# Patient Record
Sex: Male | Born: 1985 | Race: White | Hispanic: No | Marital: Married | Smoking: Former smoker
Health system: Southern US, Community
[De-identification: ages and names within clinical notes are randomized; demographics above are authoritative.]

## PROBLEM LIST (undated history)

## (undated) DIAGNOSIS — R002 Palpitations: Secondary | ICD-10-CM

## (undated) HISTORY — PX: HERNIA REPAIR: SHX51

## (undated) HISTORY — DX: Palpitations: R00.2

## (undated) HISTORY — PX: CARDIAC CATHETERIZATION: SHX172

---

## 2015-08-06 DIAGNOSIS — K591 Functional diarrhea: Secondary | ICD-10-CM | POA: Diagnosis not present

## 2015-10-30 DIAGNOSIS — R1031 Right lower quadrant pain: Secondary | ICD-10-CM | POA: Diagnosis not present

## 2015-11-07 DIAGNOSIS — N50811 Right testicular pain: Secondary | ICD-10-CM

## 2015-11-07 HISTORY — DX: Right testicular pain: N50.811

## 2015-11-15 DIAGNOSIS — K5730 Diverticulosis of large intestine without perforation or abscess without bleeding: Secondary | ICD-10-CM | POA: Diagnosis not present

## 2015-11-15 DIAGNOSIS — R1031 Right lower quadrant pain: Secondary | ICD-10-CM | POA: Diagnosis not present

## 2015-11-15 DIAGNOSIS — N5089 Other specified disorders of the male genital organs: Secondary | ICD-10-CM | POA: Diagnosis not present

## 2015-11-15 DIAGNOSIS — N50811 Right testicular pain: Secondary | ICD-10-CM | POA: Diagnosis not present

## 2016-02-22 DIAGNOSIS — R55 Syncope and collapse: Secondary | ICD-10-CM | POA: Diagnosis not present

## 2016-02-22 DIAGNOSIS — J101 Influenza due to other identified influenza virus with other respiratory manifestations: Secondary | ICD-10-CM | POA: Diagnosis not present

## 2017-06-17 DIAGNOSIS — Z6829 Body mass index (BMI) 29.0-29.9, adult: Secondary | ICD-10-CM | POA: Diagnosis not present

## 2017-06-17 DIAGNOSIS — F4323 Adjustment disorder with mixed anxiety and depressed mood: Secondary | ICD-10-CM | POA: Diagnosis not present

## 2017-06-17 DIAGNOSIS — R6889 Other general symptoms and signs: Secondary | ICD-10-CM | POA: Diagnosis not present

## 2017-08-24 DIAGNOSIS — L255 Unspecified contact dermatitis due to plants, except food: Secondary | ICD-10-CM | POA: Diagnosis not present

## 2018-02-05 DIAGNOSIS — B9789 Other viral agents as the cause of diseases classified elsewhere: Secondary | ICD-10-CM | POA: Diagnosis not present

## 2018-02-05 DIAGNOSIS — A78 Q fever: Secondary | ICD-10-CM | POA: Diagnosis not present

## 2018-02-05 DIAGNOSIS — R509 Fever, unspecified: Secondary | ICD-10-CM | POA: Diagnosis not present

## 2018-02-09 DIAGNOSIS — R748 Abnormal levels of other serum enzymes: Secondary | ICD-10-CM | POA: Diagnosis not present

## 2018-02-09 DIAGNOSIS — Z79899 Other long term (current) drug therapy: Secondary | ICD-10-CM | POA: Diagnosis not present

## 2018-02-09 DIAGNOSIS — Z6830 Body mass index (BMI) 30.0-30.9, adult: Secondary | ICD-10-CM | POA: Diagnosis not present

## 2018-02-09 DIAGNOSIS — E559 Vitamin D deficiency, unspecified: Secondary | ICD-10-CM | POA: Diagnosis not present

## 2018-02-09 DIAGNOSIS — F329 Major depressive disorder, single episode, unspecified: Secondary | ICD-10-CM | POA: Diagnosis not present

## 2018-04-13 DIAGNOSIS — J0100 Acute maxillary sinusitis, unspecified: Secondary | ICD-10-CM | POA: Diagnosis not present

## 2018-04-13 DIAGNOSIS — R509 Fever, unspecified: Secondary | ICD-10-CM | POA: Diagnosis not present

## 2018-08-09 DIAGNOSIS — R0602 Shortness of breath: Secondary | ICD-10-CM | POA: Diagnosis not present

## 2018-08-09 DIAGNOSIS — R002 Palpitations: Secondary | ICD-10-CM | POA: Diagnosis not present

## 2018-08-09 DIAGNOSIS — Z20828 Contact with and (suspected) exposure to other viral communicable diseases: Secondary | ICD-10-CM | POA: Diagnosis not present

## 2018-08-09 DIAGNOSIS — R0600 Dyspnea, unspecified: Secondary | ICD-10-CM | POA: Diagnosis not present

## 2018-08-09 DIAGNOSIS — I499 Cardiac arrhythmia, unspecified: Secondary | ICD-10-CM | POA: Diagnosis not present

## 2018-08-19 ENCOUNTER — Other Ambulatory Visit: Payer: Self-pay

## 2018-08-19 ENCOUNTER — Encounter: Payer: Self-pay | Admitting: Cardiology

## 2018-08-19 ENCOUNTER — Ambulatory Visit (INDEPENDENT_AMBULATORY_CARE_PROVIDER_SITE_OTHER): Payer: BC Managed Care – PPO | Admitting: Cardiology

## 2018-08-19 ENCOUNTER — Encounter: Payer: Self-pay | Admitting: *Deleted

## 2018-08-19 VITALS — BP 138/88 | HR 82 | Temp 97.9°F | Ht 74.0 in | Wt 239.8 lb

## 2018-08-19 DIAGNOSIS — R002 Palpitations: Secondary | ICD-10-CM

## 2018-08-19 HISTORY — DX: Palpitations: R00.2

## 2018-08-19 NOTE — Progress Notes (Signed)
Cardiology Office Note:    Date:  08/19/2018   ID:  Kevin Glenn, DOB 10/22/1985, MRN [id number]  PCP:  [facility]  Cardiologist:  Norman HerrlichBrian Melanye Hiraldo, MD   Referring MD: [facility]  ASSESSMENT:    1. Palpitations    PLAN:    In order of problems listed above:  1. The rhythm strip in the office has artifact but I am fairly sure he is in sinus rhythm at beginning sinus rhythm at the end and had an atypical atrial flutter with a mildly increased rate but less than 100 210 bpm associated with symptoms.  We discussed this and is likely the problem he had when he underwent EP [facility] I asked him to wear high fidelity ZIO monitor from 3 to 7 days sure we catch episodes often referral to EP he declines and at this time were not can put him on suppressive medications.  He has no risk factors for stroke I will not anticoagulate and he has to hold on additional testing like echocardiogram at this time.  If we can get a clear-cut documentation on a ZIO monitor I think you will accept EP referral and catheter ablation.  Next appointment 4   Medication Adjustments/Labs and Tests Ordered: Current medicines are reviewed at length with the patient today.  Concerns regarding medicines are outlined above.  Orders Placed This Encounter  Procedures   LONG TERM MONITOR (3-14 DAYS)   EKG 12-Lead   No orders of the defined types were placed in this encounter.    Chief Complaint  Patient presents with   Palpitations    History of Present Illness:    Kevin Glenn is a 33 y.o. male who is being seen today for the evaluation of SOB and arrhthymia after recent [facility] ED visit. He was seen at the ED 08/09/18 for complaints of palpitations. Notes he was previously on "metoprolol and cardizem, but has no longer needed the medication". He had cardiac evaluation in 2014 at [facility].  It also been seen on multiple occasions by EP [facility] but the records cannot be reviewed with in care everywhere.  In the ED he had two tropinin <0.01, negative d-dimer, K 4.0. CXR with no acute changes.   In 2005 he is seen by [facility] from his description he had a EP test they discussed ablation but decided to treat him with a calcium channel blocker did well for years but he stopped it about 5 years later and in particular he blamed it for weight gain.  In the interim he has had intermittent episodes never severe never sustained.  Recently has developed something different he also have a sensation of pausing irregularity he is breathless for moment and lightheaded but has not had syncope.  He is frustrated because he was seen in urgent care in the emergency room and the episodes were missed.  While he is in the office with me he started to have symptoms we hooked him up to the EKG machine I ran a rhythm strip and he has atrial flutter with a relatively slow ventricular rate.  Unfortunately there is artifact as he was sitting forward I am fairly certain that we captured a true arrhythmia I did ask him to wear a ZIO monitor for 3 to 7 days to be sure to capture his problem is happening frequently and he wants to talk to me  afterwards but is inclined to follow through with the EP and catheter ablation that was discussed in 2005.  Recent evaluation in the emergency room showed no evidence of thyroid disease he said he has had multiple echocardiograms the last few years ago and has no structural heart disease.  He takes no over-the-counter cold sinus flu allergy or decongestant medications but was put on Atarax in the emergency room that made him much worse and he discontinued.  He is a very physical athletic man and has had no symptoms with physical effort and the fact I have improved his symptoms.  He has no family history of sudden cardiac death or arrhythmia  Past Medical History:  Diagnosis Date   Palpitation     Past  Surgical History:  Procedure Laterality Date   CARDIAC CATHETERIZATION     HERNIA REPAIR      Current Medications: No outpatient medications have been marked as taking for the 08/19/18 encounter (Office Visit) with Baldo DaubMunley, Thursa Emme J, MD.     Allergies:   Pseudoephedrine   Social History   Socioeconomic History   Marital status: Married    Spouse name: Not on file   Number of children: Not on file   Years of education: Not on file   Highest education level: Not on file  Occupational History   Not on file  Social Needs   Financial resource strain: Not on file   Food insecurity    Worry: Not on file    Inability: Not on file   Transportation needs    Medical: Not on file    Non-medical: Not on file  Tobacco Use   Smoking status: Former Smoker    Packs/day: 1.00    Years: 10.00    Pack years: 10.00    Types: Cigarettes    Quit date: 2015    Years since quitting: 5.5   Smokeless tobacco: Never Used  Substance and Sexual Activity   Alcohol use: Not Currently   Drug use: Not Currently   Sexual activity: Not on file  Lifestyle   Physical activity    Days per week: Not on file    Minutes per session: Not on file   Stress: Not on file  Relationships   Social connections    Talks on phone: Not on file    Gets together: Not on file    Attends religious service: Not on file    Active member of club or organization: Not on file    Attends meetings of clubs or organizations: Not on file    Relationship status: Not on file  Other Topics Concern   Not on file  Social History Narrative   Not on file     Family History: The patient's family history includes Congestive Heart Failure in his paternal grandfather; Esophageal cancer in his maternal grandmother; Hyperlipidemia in his mother; Hypertension in his father; Lung cancer in his maternal grandmother.  ROS:   Review of Systems  Constitution: Negative.  HENT: Negative.   Eyes: Negative.     Cardiovascular: Positive for irregular heartbeat.  Respiratory: Negative.   Endocrine: Negative.   Hematologic/Lymphatic: Negative.   Skin: Negative.   Musculoskeletal: Negative.   Gastrointestinal: Negative.   Genitourinary: Negative.   Neurological: Negative.   Psychiatric/Behavioral: Positive for depression.  Allergic/Immunologic: Negative.    Please see the history of present illness.     All other systems reviewed and are negative.  EKGs/Labs/Other Studies Reviewed:    The following studies  were reviewed today:   EKG:  EKG is  ordered today.  The ekg ordered today is personally reviewed and demonstrates sinus rhythm and is normal.  Rhythm strip captures  flutter with relatively slow conduction and captures and converting to sinus rhythm.  I think this is an atypical flutter.   Physical Exam:    VS:  BP 138/88 (BP Location: Left Arm, Patient Position: Sitting, Cuff Size: Large)    Pulse 82    Temp 97.9 F (36.6 C)    Ht 6\' 2"  (1.88 m)    Wt 239 lb 12.8 oz (108.8 kg)    SpO2 98%    BMI 30.79 kg/m     Wt Readings from Last 3 Encounters:  08/19/18 239 lb 12.8 oz (108.8 kg)     GEN:  Well nourished, well developed in no acute distress HEENT: Normal NECK: No JVD; No carotid bruits LYMPHATICS: No lymphadenopathy CARDIAC: RRR, no murmurs, rubs, gallops RESPIRATORY:  Clear to auscultation without rales, wheezing or rhonchi  ABDOMEN: Soft, non-tender, non-distended MUSCULOSKELETAL:  No edema; No deformity  SKIN: Warm and dry NEUROLOGIC:  Alert and oriented x 3 PSYCHIATRIC:  Normal affect     Signed, Shirlee More, MD  08/19/2018 3:27 PM    [facility]

## 2018-08-19 NOTE — Patient Instructions (Signed)
Medication Instructions:  Your physician recommends that you continue on your current medications as directed. Please refer to the Current Medication list given to you today.  If you need a refill on your cardiac medications before your next appointment, please call your pharmacy.   Lab work: None  If you have labs (blood work) drawn today and your tests are completely normal, you will receive your results only by: Marland Kitchen MyChart Message (if you have MyChart) OR . A paper copy in the mail If you have any lab test that is abnormal or we need to change your treatment, we will call you to review the results.  Testing/Procedures: You had an EKG today.   Your physician has recommended that you wear a ZIO monitor. ZIO monitors are medical devices that record the heart's electrical activity. Doctors most often use these monitors to diagnose arrhythmias. Arrhythmias are problems with the speed or rhythm of the heartbeat. The monitor is a small, portable device. You can wear one while you do your normal daily activities. This is usually used to diagnose what is causing palpitations/syncope (passing out). Wear for 7 days.   Follow-Up: At [facility], you and your health needs are our priority.  As part of our continuing mission to provide you with exceptional heart care, we have created designated Provider Care Teams.  These Care Teams include your primary Cardiologist (physician) and Advanced Practice Providers (APPs -  Physician Assistants and Nurse Practitioners) who all work together to provide you with the care you need, when you need it. You will need a follow up appointment in 4 weeks.

## 2018-09-16 ENCOUNTER — Ambulatory Visit: Payer: BC Managed Care – PPO | Admitting: Cardiology

## 2018-11-06 NOTE — Progress Notes (Deleted)
Cardiology Office Note:    Date:  11/06/2018   ID:  Kevin Glenn, DOB [birth year], MRN [id number]  PCP:  [facility]  Cardiologist:  Norman Herrlich, MD    Referring MD: [facility], [facility]    ASSESSMENT:    No diagnosis found. PLAN:    In order of problems listed above:  1. ***   Next appointment: ***   Medication Adjustments/Labs and Tests Ordered: Current medicines are reviewed at length with the patient today.  Concerns regarding medicines are outlined above.  No orders of the defined types were placed in this encounter.  No orders of the defined types were placed in this encounter.   No chief complaint on file.   History of Present Illness:    Kevin Glenn is a 33 y.o. male with a hx of atypical atrial flutter or atrial tachycardia captured on a rhythm strip in my office last seen 08/19/2018.Marland Kitchen Compliance with diet, lifestyle and medications: ***  He was seen at the ED 08/09/18 for complaints of palpitations. Notes he was previously on "metoprolol and cardizem, but has no longer needed the medication". He had cardiac evaluation in 2014 at [facility].  It also been seen on multiple occasions by EP 2005 [facility] but the records cannot be reviewed with in care everywhere.  In the ED he had two tropinin <0.01, negative d-dimer, K 4.0. CXR with no acute changes.    In 2005 he is seen by EP [facility] from his description he had a EP test they discussed ablation but decided to treat him with a calcium channel blocker did well for years but he stopped it about 5 years later and in particular he blamed it for weight gain.  In the interim he has had intermittent episodes never severe never sustained.  Recently has developed something different he also have a sensation of pausing irregularity he is breathless for moment and lightheaded but has not had syncope.  He is frustrated because he was seen in urgent care in the emergency room  and the episodes were missed.  While he is in the office with me 08/19/2010 he started to have symptoms we hooked him up to the EKG machine I ran a rhythm strip and he has atrial flutter with a relatively slow ventricular rate.  Past Medical History:  Diagnosis Date  . Palpitation     Past Surgical History:  Procedure Laterality Date  . CARDIAC CATHETERIZATION    . HERNIA REPAIR      Current Medications: No outpatient medications have been marked as taking for the 11/10/18 encounter (Appointment) with Baldo Daub, MD.     Allergies:   Pseudoephedrine   Social History   Socioeconomic History  . Marital status: Married    Spouse name: Not on file  . Number of children: Not on file  . Years of education: Not on file  . Highest education level: Not on file  Occupational History  . Not on file  Social Needs  . Financial resource strain: Not on file  . Food insecurity    Worry: Not on file    Inability: Not on file  . Transportation needs    Medical: Not on file    Non-medical: Not on file  Tobacco Use  . Smoking status: Former Smoker    Packs/day: 1.00    Years: 10.00    Pack years: 10.00    Types: Cigarettes  Quit date: 2015    Years since quitting: 5.7  . Smokeless tobacco: Never Used  Substance and Sexual Activity  . Alcohol use: Not Currently  . Drug use: Not Currently  . Sexual activity: Not on file  Lifestyle  . Physical activity    Days per week: Not on file    Minutes per session: Not on file  . Stress: Not on file  Relationships  . Social Herbalist on phone: Not on file    Gets together: Not on file    Attends religious service: Not on file    Active member of club or organization: Not on file    Attends meetings of clubs or organizations: Not on file    Relationship status: Not on file  Other Topics Concern  . Not on file  Social History Narrative  . Not on file     Family History: The patient's ***family history includes  Congestive Heart Failure in his paternal grandfather; Esophageal cancer in his maternal grandmother; Hyperlipidemia in his mother; Hypertension in his father; Lung cancer in his maternal grandmother. ROS:   Please see the history of present illness.    All other systems reviewed and are negative.  EKGs/Labs/Other Studies Reviewed:    The following studies were reviewed today:  EKG:  EKG ordered today and personally reviewed.  The ekg ordered today demonstrates ***  Recent Labs: No results found for requested labs within last 8760 hours.  Recent Lipid Panel No results found for: CHOL, TRIG, HDL, CHOLHDL, VLDL, LDLCALC, LDLDIRECT  Physical Exam:    VS:  There were no vitals taken for this visit.    Wt Readings from Last 3 Encounters:  08/19/18 239 lb 12.8 oz (108.8 kg)     GEN: *** Well nourished, well developed in no acute distress HEENT: Normal NECK: No JVD; No carotid bruits LYMPHATICS: No lymphadenopathy CARDIAC: ***RRR, no murmurs, rubs, gallops RESPIRATORY:  Clear to auscultation without rales, wheezing or rhonchi  ABDOMEN: Soft, non-tender, non-distended MUSCULOSKELETAL:  No edema; No deformity  SKIN: Warm and dry NEUROLOGIC:  Alert and oriented x 3 PSYCHIATRIC:  Normal affect    Signed, Shirlee More, MD  11/06/2018 2:26 PM    [place]

## 2018-11-10 ENCOUNTER — Ambulatory Visit: Payer: BC Managed Care – PPO | Admitting: Cardiology

## 2018-11-24 ENCOUNTER — Telehealth: Payer: Self-pay | Admitting: *Deleted

## 2018-11-24 NOTE — Telephone Encounter (Signed)
Called patient to check and see if he has mailed his ZIO monitor back to the company for processing. No answer, unable to leave message. Will continue efforts.

## 2018-11-24 NOTE — Telephone Encounter (Signed)
-----   Message from Richardo Priest, MD sent at 11/22/2018  8:34 AM EDT ----- Regarding: FW: Lost Monitor Barnetta Chapel will you please call him and inquire if he still has the monitor there he wants to send it in for evaluation. ----- Message ----- From: Austin Miles, RN Sent: 11/22/2018   8:18 AM EDT To: Richardo Priest, MD Subject: Melton Alar: Lost Monitor                               FYI  ----- Message ----- From: Anselm Pancoast, CMA Sent: 11/19/2018   3:44 PM EDT To: Austin Miles, RN Subject: Lost Monitor                                   Dr. Bettina Gavia asked about this Pt's monitor. I have looked in iRhthm and it states that this monitor is "Lost".  ----- Message ----- From: Richardo Priest, MD Sent: 11/06/2018   2:27 PM EDT To: Anselm Pancoast, CMA  Has his ZIO monitor been processed?

## 2018-11-26 NOTE — Telephone Encounter (Signed)
Called and left a message for our [employer] representative, Ailene Ravel, to return call to discuss.

## 2018-11-26 NOTE — Telephone Encounter (Signed)
Spoke with patient who confirms that he wore the 7 day ZIO monitor and mailed it back to the company sometime in September 2020.

## 2018-11-26 NOTE — Telephone Encounter (Signed)
Kevin Glenn returned my call and verified that the monitor was never received by the company for processing. Kevin Glenn explained that this does happen on occasion but only approximately 2-3% of the time when returning the monitors via mail. Please advise if you would like the patient to wear another monitor at no cost. Thanks!

## 2018-11-26 NOTE — Telephone Encounter (Signed)
Can you call the Zio rep to investigate. If truly lost they should repeat at no cost

## 2018-11-27 NOTE — Telephone Encounter (Signed)
Yes repeat Zio

## 2018-11-29 NOTE — Telephone Encounter (Signed)
Attempted to contact patient with no answer, unable to leave message. Will continue efforts.  

## 2018-12-07 NOTE — Telephone Encounter (Signed)
Attempted to contact patient with no answer, unable to leave message. Will continue efforts.  

## 2018-12-21 NOTE — Telephone Encounter (Signed)
Spoke with patient and advised that Dr. Bettina Gavia recommends repeating the 7 day ZIO monitor since the previous monitor was lost in the mail. Patient reports no symptoms in quite some time and does not feel the need to repeat the monitor now. Patient will contact our office if symptoms worsen or resurface at any time as he will be willing to repeat the monitor then. No further questions.

## 2019-01-26 DIAGNOSIS — J069 Acute upper respiratory infection, unspecified: Secondary | ICD-10-CM | POA: Diagnosis not present

## 2019-01-26 DIAGNOSIS — Z20828 Contact with and (suspected) exposure to other viral communicable diseases: Secondary | ICD-10-CM | POA: Diagnosis not present

## 2019-08-19 DIAGNOSIS — A6920 Lyme disease, unspecified: Secondary | ICD-10-CM | POA: Diagnosis not present

## 2019-08-19 DIAGNOSIS — D649 Anemia, unspecified: Secondary | ICD-10-CM | POA: Diagnosis not present

## 2019-08-19 DIAGNOSIS — M25511 Pain in right shoulder: Secondary | ICD-10-CM | POA: Diagnosis not present

## 2019-08-19 DIAGNOSIS — R748 Abnormal levels of other serum enzymes: Secondary | ICD-10-CM | POA: Diagnosis not present

## 2019-08-19 DIAGNOSIS — E559 Vitamin D deficiency, unspecified: Secondary | ICD-10-CM | POA: Diagnosis not present

## 2019-08-19 DIAGNOSIS — R768 Other specified abnormal immunological findings in serum: Secondary | ICD-10-CM | POA: Diagnosis not present

## 2019-08-19 DIAGNOSIS — Z8679 Personal history of other diseases of the circulatory system: Secondary | ICD-10-CM | POA: Diagnosis not present

## 2019-08-19 DIAGNOSIS — G8929 Other chronic pain: Secondary | ICD-10-CM | POA: Diagnosis not present

## 2019-08-23 DIAGNOSIS — D649 Anemia, unspecified: Secondary | ICD-10-CM | POA: Diagnosis not present

## 2019-09-14 DIAGNOSIS — Z20828 Contact with and (suspected) exposure to other viral communicable diseases: Secondary | ICD-10-CM | POA: Diagnosis not present

## 2019-09-14 DIAGNOSIS — R509 Fever, unspecified: Secondary | ICD-10-CM | POA: Diagnosis not present

## 2019-12-22 DIAGNOSIS — R1011 Right upper quadrant pain: Secondary | ICD-10-CM | POA: Diagnosis not present

## 2019-12-22 DIAGNOSIS — Z1331 Encounter for screening for depression: Secondary | ICD-10-CM | POA: Diagnosis not present

## 2019-12-22 DIAGNOSIS — Z6831 Body mass index (BMI) 31.0-31.9, adult: Secondary | ICD-10-CM | POA: Diagnosis not present

## 2019-12-27 DIAGNOSIS — K760 Fatty (change of) liver, not elsewhere classified: Secondary | ICD-10-CM | POA: Diagnosis not present

## 2019-12-27 DIAGNOSIS — R1011 Right upper quadrant pain: Secondary | ICD-10-CM | POA: Diagnosis not present

## 2020-03-23 DIAGNOSIS — R1011 Right upper quadrant pain: Secondary | ICD-10-CM | POA: Diagnosis not present

## 2020-03-23 DIAGNOSIS — R110 Nausea: Secondary | ICD-10-CM | POA: Diagnosis not present

## 2020-03-23 DIAGNOSIS — Z6831 Body mass index (BMI) 31.0-31.9, adult: Secondary | ICD-10-CM | POA: Diagnosis not present

## 2020-03-27 DIAGNOSIS — R1011 Right upper quadrant pain: Secondary | ICD-10-CM | POA: Diagnosis not present

## 2020-03-27 DIAGNOSIS — R197 Diarrhea, unspecified: Secondary | ICD-10-CM | POA: Diagnosis not present

## 2020-03-29 ENCOUNTER — Telehealth: Payer: Self-pay | Admitting: Cardiology

## 2020-03-29 DIAGNOSIS — R1011 Right upper quadrant pain: Secondary | ICD-10-CM

## 2020-03-29 HISTORY — DX: Right upper quadrant pain: R10.11

## 2020-03-29 NOTE — Telephone Encounter (Signed)
Pt has appt 04/02/20 with Dr. Dulce Sellar. Will send notes to MD for upcoming appt.

## 2020-03-29 NOTE — Telephone Encounter (Signed)
   Primary Cardiologist: Norman Herrlich, MD  Chart reviewed as part of pre-operative protocol coverage. Because of Trevious Rampey Wachter's past medical history and time since last visit, he will require a follow-up visit in order to better assess preoperative cardiovascular risk.  Pre-op covering staff: - Please schedule appointment and call patient to inform them. If patient already had an upcoming appointment within acceptable timeframe, please add "pre-op clearance" to the appointment notes so provider is aware. - Please contact requesting surgeon's office via preferred method (i.e, phone, fax) to inform them of need for appointment prior to surgery.  If applicable, this message will also be routed to pharmacy pool and/or primary cardiologist for input on holding anticoagulant/antiplatelet agent as requested below so that this information is available to the clearing provider at time of patient's appointment.   [place], [place]  03/29/2020, 3:54 PM

## 2020-03-29 NOTE — Telephone Encounter (Signed)
° °  [facility] Pre-operative Risk Assessment    Request for surgical clearance:  1. What type of surgery is being performed? Galbladder Removal   2. When is this surgery scheduled? TBD   3. What type of clearance is required (medical clearance vs. Pharmacy clearance to hold med vs. Both)? Medical Clearance  4. Are there any medications that need to be held prior to surgery and how long? No   5. Practice name and name of physician performing surgery? [facility]   Dr. Corena Pilgrim   6. What is your office phone number? [phone]   7.   What is your office fax number? [phone]  8.   Anesthesia type (None, local, MAC, general) ?  Tonya with Dr. Damita Dunnings office states she is unsure, but their office plans to fax a cardiac optimization which should include the type of anesthesia   Zara Council 03/29/2020, 2:39 PM  _________________________________________________________________

## 2020-03-30 ENCOUNTER — Telehealth: Payer: Self-pay

## 2020-03-30 DIAGNOSIS — R55 Syncope and collapse: Secondary | ICD-10-CM

## 2020-03-30 HISTORY — DX: Syncope and collapse: R55

## 2020-03-30 NOTE — Telephone Encounter (Signed)
This is a duplicate request for the same procedure. Please see preop note from yesterday. Patient is scheduled to see Dr. Dulce Sellar on 04/02/2020.

## 2020-03-30 NOTE — Telephone Encounter (Signed)
   [facility] Pre-operative Risk Assessment    HEARTCARE STAFF: - Please ensure there is not already an duplicate clearance open for this procedure. - Under Visit Info/Reason for Call, type in Other and utilize the format Clearance MM/DD/YY or Clearance TBD. Do not use dashes or single digits. - If request is for dental extraction, please clarify the # of teeth to be extracted.  Request for surgical clearance:  1. What type of surgery is being performed? Laparoscopic Cholecystectomy   2. When is this surgery scheduled? 04/13/2020   3. What type of clearance is required (medical clearance vs. Pharmacy clearance to hold med vs. Both)? Both  4. Are there any medications that need to be held prior to surgery and how long? None noted   5. Practice name and name of physician performing surgery? [facility]. Dr. Coralie Keens   6. What is the office phone number? [phone]   7.   What is the office fax number? [phone]  8.   Anesthesia type (None, local, MAC, general) ? General   Kevin Glenn 03/30/2020, 9:46 AM  _________________________________________________________________   (provider comments below)

## 2020-03-31 NOTE — Progress Notes (Signed)
Cardiology Office Note:    Date:  04/02/2020   ID:  Kevin Glenn, DOB [birth year], MRN [id number]  PCP:  [facility]  Cardiologist:  Norman Herrlich, MD   Referring MD: [facility], [facility]*  ASSESSMENT:    1. Preoperative cardiovascular examination   2. Typical atrial flutter (HCC)    PLAN:    In order of problems listed above:  1. From a cardiology perspective he is at very low risk for his planned surgery he has had no recurrence of arrhythmia or palpitation normal EKG he is optimized and requires no further cardiology evaluation for planned cholecystectomy liver biopsy outpatient. 2. Stable his episode was brief self-limited and no recurrence is stroke risk is very low and does not require suppressant therapy or anticoagulation unless he has clinical recurrence  Next appointment as needed   Medication Adjustments/Labs and Tests Ordered: Current medicines are reviewed at length with the patient today.  Concerns regarding medicines are outlined above.  No orders of the defined types were placed in this encounter.  No orders of the defined types were placed in this encounter.    Chief Complaint  Patient presents with  . Pre-op Exam  . Atrial Flutter    History of Present Illness:    Kevin Glenn is a 35 y.o. male who is being seen today for preoperative cardiology evaluation prior to laparoscopic cholecystectomy scheduled 04/15/2019 Dr. Maryruth Bun at the request of [facility]*.  I had seen him previously 08/19/2018 with palpitation with  transient atrial flutter on a previous EKG.  Since then he has had no palpitation and from a cardiac perspective is done well He has had no chest pain shortness of palpitation or edema. He is having right upper quadrant pain and tells me he is going to have cholecystectomy and liver biopsy. He asked me to order liver tests recommended by his surgeon if we can get an answer from the office what they want I  will put an order in otherwise he will have a performed in [place]. His revised surgical risk index is 0 low risk His EKG today sinus rhythm normal Past Medical History:  Diagnosis Date  . Pain in right testicle 11/07/2015  . Palpitation   . Palpitations 08/19/2018  . Right upper quadrant pain 03/29/2020  . Syncope 03/30/2020    Past Surgical History:  Procedure Laterality Date  . CARDIAC CATHETERIZATION    . HERNIA REPAIR      Current Medications: No outpatient medications have been marked as taking for the 04/02/20 encounter (Office Visit) with Baldo Daub, MD.     Allergies:   Pseudoephedrine   Social History   Socioeconomic History  . Marital status: Married    Spouse name: Not on file  . Number of children: Not on file  . Years of education: Not on file  . Highest education level: Not on file  Occupational History  . Not on file  Tobacco Use  . Smoking status: Former Smoker    Packs/day: 1.00    Years: 10.00    Pack years: 10.00    Types: Cigarettes    Quit date: 2015    Years since quitting: 7.1  . Smokeless tobacco: Never Used  Vaping Use  . Vaping Use: Never used  Substance and Sexual Activity  . Alcohol use: Not Currently  . Drug use: Not Currently  . Sexual activity: Not on file  Other Topics Concern  . Not on  file  Social History Narrative  . Not on file   Social Determinants of Health   Financial Resource Strain: Not on file  Food Insecurity: Not on file  Transportation Needs: Not on file  Physical Activity: Not on file  Stress: Not on file  Social Connections: Not on file     Family History: The patient's family history includes Congestive Heart Failure in his paternal grandfather; Esophageal cancer in his maternal grandmother; Hyperlipidemia in his mother; Hypertension in his father; Lung cancer in his maternal grandmother.  ROS:   ROS Please see the history of present illness.     All other systems reviewed and are  negative.  EKGs/Labs/Other Studies Reviewed:    The following studies were reviewed today:   EKG:  EKG is  ordered today.  The ekg ordered today is personally reviewed and demonstrates sinus rhythm normal  Recent Labs: Most recent 12/11/2018 cholesterol 131 triglycerides 161 HDL 24 03/23/2020 creatinine 1.10  Physical Exam:    VS:  BP 132/80   Pulse 81   Ht 6\' 2"  (1.88 m)   Wt 254 lb (115.2 kg)   SpO2 97%   BMI 32.61 kg/m     Wt Readings from Last 3 Encounters:  04/02/20 254 lb (115.2 kg)  08/19/18 239 lb 12.8 oz (108.8 kg)     GEN: Healthy appearing well nourished, well developed in no acute distress HEENT: Normal NECK: No JVD; No carotid bruits LYMPHATICS: No lymphadenopathy CARDIAC: RRR, no murmurs, rubs, gallops RESPIRATORY:  Clear to auscultation without rales, wheezing or rhonchi  ABDOMEN: Soft, non-tender, non-distended MUSCULOSKELETAL:  No edema; No deformity  SKIN: Warm and dry NEUROLOGIC:  Alert and oriented x 3 PSYCHIATRIC:  Normal affect     Signed, 08/21/18, MD  04/02/2020 3:55 PM    [facility]

## 2020-04-02 ENCOUNTER — Ambulatory Visit: Payer: BC Managed Care – PPO | Admitting: Cardiology

## 2020-04-02 ENCOUNTER — Encounter: Payer: Self-pay | Admitting: Cardiology

## 2020-04-02 ENCOUNTER — Other Ambulatory Visit: Payer: Self-pay

## 2020-04-02 VITALS — BP 132/80 | HR 81 | Ht 74.0 in | Wt 254.0 lb

## 2020-04-02 DIAGNOSIS — R1011 Right upper quadrant pain: Secondary | ICD-10-CM | POA: Diagnosis not present

## 2020-04-02 DIAGNOSIS — Z01810 Encounter for preprocedural cardiovascular examination: Secondary | ICD-10-CM | POA: Diagnosis not present

## 2020-04-02 DIAGNOSIS — I483 Typical atrial flutter: Secondary | ICD-10-CM

## 2020-04-02 NOTE — Patient Instructions (Signed)
Medication Instructions:  No medication changes. *If you need a refill on your cardiac medications before your next appointment, please call your pharmacy*   Lab Work: None ordered If you have labs (blood work) drawn today and your tests are completely normal, you will receive your results only by: Marland Kitchen MyChart Message (if you have MyChart) OR . A paper copy in the mail If you have any lab test that is abnormal or we need to change your treatment, we will call you to review the results.   Testing/Procedures: None ordered   Follow-Up: At [facility], you and your health needs are our priority.  As part of our continuing mission to provide you with exceptional heart care, we have created designated Provider Care Teams.  These Care Teams include your primary Cardiologist (physician) and Advanced Practice Providers (APPs -  Physician Assistants and Nurse Practitioners) who all work together to provide you with the care you need, when you need it.  We recommend signing up for the patient portal called "MyChart".  Sign up information is provided on this After Visit Summary.  MyChart is used to connect with patients for Virtual Visits (Telemedicine).  Patients are able to view lab/test results, encounter notes, upcoming appointments, etc.  Non-urgent messages can be sent to your provider as well.   To learn more about what you can do with MyChart, go to [email or web address].    Your next appointment:   As needed   The format for your next appointment:   In Person  Provider:   Norman Herrlich, MD   Other Instructions  Echocardiogram An echocardiogram is a test that uses sound waves (ultrasound) to produce images of the heart. Images from an echocardiogram can provide important information about:  Heart size and shape.  The size and thickness and movement of your heart's walls.  Heart muscle function and strength.  Heart valve function or if you have stenosis. Stenosis is when the  heart valves are too narrow.  If blood is flowing backward through the heart valves (regurgitation).  A tumor or infectious growth around the heart valves.  Areas of heart muscle that are not working well because of poor blood flow or injury from a heart attack.  Aneurysm detection. An aneurysm is a weak or damaged part of an artery wall. The wall bulges out from the normal force of blood pumping through the body. Tell a health care provider about:  Any allergies you have.  All medicines you are taking, including vitamins, herbs, eye drops, creams, and over-the-counter medicines.  Any blood disorders you have.  Any surgeries you have had.  Any medical conditions you have.  Whether you are pregnant or may be pregnant. What are the risks? Generally, this is a safe test. However, problems may occur, including an allergic reaction to dye (contrast) that may be used during the test. What happens before the test? No specific preparation is needed. You may eat and drink normally. What happens during the test?  You will take off your clothes from the waist up and put on a hospital gown.  Electrodes or electrocardiogram (ECG)patches may be placed on your chest. The electrodes or patches are then connected to a device that monitors your heart rate and rhythm.  You will lie down on a table for an ultrasound exam. A gel will be applied to your chest to help sound waves pass through your skin.  A handheld device, called a transducer, will be pressed against your chest and  moved over your heart. The transducer produces sound waves that travel to your heart and bounce back (or "echo" back) to the transducer. These sound waves will be captured in real-time and changed into images of your heart that can be viewed on a video monitor. The images will be recorded on a computer and reviewed by your health care provider.  You may be asked to change positions or hold your breath for a short time. This  makes it easier to get different views or better views of your heart.  In some cases, you may receive contrast through an IV in one of your veins. This can improve the quality of the pictures from your heart. The procedure may vary among health care providers and hospitals.   What can I expect after the test? You may return to your normal, everyday life, including diet, activities, and medicines, unless your health care provider tells you not to do that. Follow these instructions at home:  It is up to you to get the results of your test. Ask your health care provider, or the department that is doing the test, when your results will be ready.  Keep all follow-up visits. This is important. Summary  An echocardiogram is a test that uses sound waves (ultrasound) to produce images of the heart.  Images from an echocardiogram can provide important information about the size and shape of your heart, heart muscle function, heart valve function, and other possible heart problems.  You do not need to do anything to prepare before this test. You may eat and drink normally.  After the echocardiogram is completed, you may return to your normal, everyday life, unless your health care provider tells you not to do that. This information is not intended to replace advice given to you by your health care provider. Make sure you discuss any questions you have with your health care provider. Document Revised: 09/13/2019 Document Reviewed: 09/13/2019 Elsevier Patient Education  2021 [employer].

## 2020-04-03 LAB — HEPATITIS PANEL, ACUTE
Hep A IgM: NEGATIVE
Hep B C IgM: NEGATIVE
Hep C Virus Ab: <0.1 s/co ratio (ref 0.0–0.9)
Hepatitis B Surface Ag: NEGATIVE

## 2020-04-10 DIAGNOSIS — Z1152 Encounter for screening for COVID-19: Secondary | ICD-10-CM | POA: Diagnosis not present

## 2020-04-13 DIAGNOSIS — Z9049 Acquired absence of other specified parts of digestive tract: Secondary | ICD-10-CM | POA: Diagnosis not present

## 2020-04-13 DIAGNOSIS — R1011 Right upper quadrant pain: Secondary | ICD-10-CM | POA: Diagnosis not present

## 2020-04-13 DIAGNOSIS — R0683 Snoring: Secondary | ICD-10-CM | POA: Diagnosis not present

## 2020-04-13 DIAGNOSIS — K811 Chronic cholecystitis: Secondary | ICD-10-CM | POA: Diagnosis not present

## 2020-04-13 DIAGNOSIS — K660 Peritoneal adhesions (postprocedural) (postinfection): Secondary | ICD-10-CM | POA: Diagnosis not present

## 2020-04-13 DIAGNOSIS — F418 Other specified anxiety disorders: Secondary | ICD-10-CM | POA: Diagnosis not present

## 2020-04-13 DIAGNOSIS — E669 Obesity, unspecified: Secondary | ICD-10-CM | POA: Diagnosis not present

## 2020-04-13 DIAGNOSIS — K760 Fatty (change of) liver, not elsewhere classified: Secondary | ICD-10-CM | POA: Diagnosis not present

## 2020-04-13 DIAGNOSIS — Z6833 Body mass index (BMI) 33.0-33.9, adult: Secondary | ICD-10-CM | POA: Diagnosis not present

## 2020-04-13 DIAGNOSIS — K8010 Calculus of gallbladder with chronic cholecystitis without obstruction: Secondary | ICD-10-CM | POA: Diagnosis not present

## 2020-11-12 DIAGNOSIS — Z6832 Body mass index (BMI) 32.0-32.9, adult: Secondary | ICD-10-CM | POA: Diagnosis not present

## 2020-11-12 DIAGNOSIS — R109 Unspecified abdominal pain: Secondary | ICD-10-CM | POA: Diagnosis not present

## 2020-11-15 ENCOUNTER — Other Ambulatory Visit (HOSPITAL_BASED_OUTPATIENT_CLINIC_OR_DEPARTMENT_OTHER): Payer: Self-pay | Admitting: Registered Nurse

## 2020-11-15 ENCOUNTER — Other Ambulatory Visit: Payer: Self-pay

## 2020-11-15 ENCOUNTER — Ambulatory Visit (HOSPITAL_BASED_OUTPATIENT_CLINIC_OR_DEPARTMENT_OTHER)
Admission: RE | Admit: 2020-11-15 | Discharge: 2020-11-15 | Disposition: A | Discharge status: Home / Self Care | Payer: BC Managed Care – PPO | Source: Ambulatory Visit | Attending: Registered Nurse | Admitting: Registered Nurse

## 2020-11-15 DIAGNOSIS — R109 Unspecified abdominal pain: Secondary | ICD-10-CM

## 2020-11-15 MED ORDER — IOHEXOL 300 MG/ML  SOLN
100.0000 mL | Freq: Once | INTRAMUSCULAR | Status: AC | PRN
  Administered 2020-11-15: 100 mL via INTRAVENOUS

## 2021-01-14 DIAGNOSIS — R051 Acute cough: Secondary | ICD-10-CM | POA: Diagnosis not present

## 2021-01-14 DIAGNOSIS — Z20828 Contact with and (suspected) exposure to other viral communicable diseases: Secondary | ICD-10-CM | POA: Diagnosis not present

## 2021-01-24 DIAGNOSIS — M546 Pain in thoracic spine: Secondary | ICD-10-CM | POA: Diagnosis not present

## 2021-01-24 DIAGNOSIS — M5442 Lumbago with sciatica, left side: Secondary | ICD-10-CM | POA: Diagnosis not present

## 2021-01-24 DIAGNOSIS — M62830 Muscle spasm of back: Secondary | ICD-10-CM | POA: Diagnosis not present

## 2021-01-28 DIAGNOSIS — Z20828 Contact with and (suspected) exposure to other viral communicable diseases: Secondary | ICD-10-CM | POA: Diagnosis not present

## 2021-01-28 DIAGNOSIS — J111 Influenza due to unidentified influenza virus with other respiratory manifestations: Secondary | ICD-10-CM | POA: Diagnosis not present

## 2021-01-31 DIAGNOSIS — Z6832 Body mass index (BMI) 32.0-32.9, adult: Secondary | ICD-10-CM | POA: Diagnosis not present

## 2021-01-31 DIAGNOSIS — M5416 Radiculopathy, lumbar region: Secondary | ICD-10-CM | POA: Diagnosis not present

## 2021-01-31 DIAGNOSIS — D649 Anemia, unspecified: Secondary | ICD-10-CM | POA: Diagnosis not present

## 2021-01-31 DIAGNOSIS — R748 Abnormal levels of other serum enzymes: Secondary | ICD-10-CM | POA: Diagnosis not present

## 2021-02-05 DIAGNOSIS — M5442 Lumbago with sciatica, left side: Secondary | ICD-10-CM | POA: Diagnosis not present

## 2021-02-05 DIAGNOSIS — M62830 Muscle spasm of back: Secondary | ICD-10-CM | POA: Diagnosis not present

## 2021-02-05 DIAGNOSIS — M546 Pain in thoracic spine: Secondary | ICD-10-CM | POA: Diagnosis not present

## 2021-03-05 DIAGNOSIS — R109 Unspecified abdominal pain: Secondary | ICD-10-CM | POA: Diagnosis not present

## 2021-03-19 DIAGNOSIS — M545 Low back pain, unspecified: Secondary | ICD-10-CM | POA: Diagnosis not present

## 2021-03-19 DIAGNOSIS — Z6831 Body mass index (BMI) 31.0-31.9, adult: Secondary | ICD-10-CM | POA: Diagnosis not present

## 2021-05-25 DIAGNOSIS — L255 Unspecified contact dermatitis due to plants, except food: Secondary | ICD-10-CM | POA: Diagnosis not present

## 2021-05-25 DIAGNOSIS — R21 Rash and other nonspecific skin eruption: Secondary | ICD-10-CM | POA: Diagnosis not present

## 2021-07-11 DIAGNOSIS — S60931A Unspecified superficial injury of right thumb, initial encounter: Secondary | ICD-10-CM | POA: Diagnosis not present

## 2021-07-11 DIAGNOSIS — M79644 Pain in right finger(s): Secondary | ICD-10-CM | POA: Diagnosis not present

## 2021-07-18 DIAGNOSIS — S60131A Contusion of right middle finger with damage to nail, initial encounter: Secondary | ICD-10-CM | POA: Diagnosis not present

## 2021-07-26 DIAGNOSIS — M79644 Pain in right finger(s): Secondary | ICD-10-CM | POA: Diagnosis not present

## 2021-07-26 DIAGNOSIS — S60931D Unspecified superficial injury of right thumb, subsequent encounter: Secondary | ICD-10-CM | POA: Diagnosis not present

## 2021-07-26 DIAGNOSIS — S60131D Contusion of right middle finger with damage to nail, subsequent encounter: Secondary | ICD-10-CM | POA: Diagnosis not present

## 2021-09-24 DIAGNOSIS — M549 Dorsalgia, unspecified: Secondary | ICD-10-CM | POA: Diagnosis not present

## 2021-09-24 DIAGNOSIS — N410 Acute prostatitis: Secondary | ICD-10-CM | POA: Diagnosis not present

## 2021-09-24 DIAGNOSIS — R109 Unspecified abdominal pain: Secondary | ICD-10-CM | POA: Diagnosis not present

## 2021-09-24 DIAGNOSIS — N5319 Other ejaculatory dysfunction: Secondary | ICD-10-CM | POA: Diagnosis not present

## 2021-10-21 DIAGNOSIS — R739 Hyperglycemia, unspecified: Secondary | ICD-10-CM | POA: Diagnosis not present

## 2021-10-21 DIAGNOSIS — E559 Vitamin D deficiency, unspecified: Secondary | ICD-10-CM | POA: Diagnosis not present

## 2021-10-21 DIAGNOSIS — N400 Enlarged prostate without lower urinary tract symptoms: Secondary | ICD-10-CM | POA: Diagnosis not present

## 2021-10-21 DIAGNOSIS — Z6832 Body mass index (BMI) 32.0-32.9, adult: Secondary | ICD-10-CM | POA: Diagnosis not present

## 2022-02-03 DIAGNOSIS — J0110 Acute frontal sinusitis, unspecified: Secondary | ICD-10-CM | POA: Diagnosis not present

## 2022-02-19 DIAGNOSIS — D485 Neoplasm of uncertain behavior of skin: Secondary | ICD-10-CM | POA: Diagnosis not present

## 2022-02-19 DIAGNOSIS — L728 Other follicular cysts of the skin and subcutaneous tissue: Secondary | ICD-10-CM | POA: Diagnosis not present

## 2022-04-15 DIAGNOSIS — M79604 Pain in right leg: Secondary | ICD-10-CM | POA: Diagnosis not present

## 2022-04-16 DIAGNOSIS — M545 Low back pain, unspecified: Secondary | ICD-10-CM | POA: Diagnosis not present

## 2022-04-16 DIAGNOSIS — Z6832 Body mass index (BMI) 32.0-32.9, adult: Secondary | ICD-10-CM | POA: Diagnosis not present

## 2022-04-16 DIAGNOSIS — R1909 Other intra-abdominal and pelvic swelling, mass and lump: Secondary | ICD-10-CM | POA: Diagnosis not present

## 2022-04-17 DIAGNOSIS — R1030 Lower abdominal pain, unspecified: Secondary | ICD-10-CM | POA: Diagnosis not present

## 2022-04-17 DIAGNOSIS — K4130 Unilateral femoral hernia, with obstruction, without gangrene, not specified as recurrent: Secondary | ICD-10-CM | POA: Diagnosis not present

## 2022-04-17 DIAGNOSIS — R599 Enlarged lymph nodes, unspecified: Secondary | ICD-10-CM | POA: Diagnosis not present

## 2022-04-17 DIAGNOSIS — N50811 Right testicular pain: Secondary | ICD-10-CM | POA: Diagnosis not present

## 2022-04-17 DIAGNOSIS — R1011 Right upper quadrant pain: Secondary | ICD-10-CM | POA: Diagnosis not present

## 2022-04-17 DIAGNOSIS — R1031 Right lower quadrant pain: Secondary | ICD-10-CM | POA: Diagnosis not present

## 2022-04-17 DIAGNOSIS — R1909 Other intra-abdominal and pelvic swelling, mass and lump: Secondary | ICD-10-CM | POA: Diagnosis not present

## 2022-04-22 ENCOUNTER — Other Ambulatory Visit (HOSPITAL_COMMUNITY)
Admission: RE | Admit: 2022-04-22 | Discharge: 2022-04-22 | Disposition: A | Discharge status: Home / Self Care | Payer: BC Managed Care – PPO | Source: Ambulatory Visit | Attending: Family Medicine | Admitting: Family Medicine

## 2022-04-22 DIAGNOSIS — L041 Acute lymphadenitis of trunk: Secondary | ICD-10-CM | POA: Diagnosis not present

## 2022-04-22 DIAGNOSIS — E041 Nontoxic single thyroid nodule: Secondary | ICD-10-CM | POA: Diagnosis not present

## 2022-04-22 DIAGNOSIS — R590 Localized enlarged lymph nodes: Secondary | ICD-10-CM | POA: Diagnosis not present

## 2022-04-22 DIAGNOSIS — I888 Other nonspecific lymphadenitis: Secondary | ICD-10-CM | POA: Diagnosis not present

## 2022-04-22 DIAGNOSIS — R599 Enlarged lymph nodes, unspecified: Secondary | ICD-10-CM | POA: Diagnosis not present

## 2022-04-28 LAB — SURGICAL PATHOLOGY

## 2022-05-15 DIAGNOSIS — R599 Enlarged lymph nodes, unspecified: Secondary | ICD-10-CM | POA: Diagnosis not present

## 2022-12-11 DIAGNOSIS — H9203 Otalgia, bilateral: Secondary | ICD-10-CM | POA: Diagnosis not present

## 2023-01-29 DIAGNOSIS — R070 Pain in throat: Secondary | ICD-10-CM | POA: Diagnosis not present

## 2023-01-29 DIAGNOSIS — J069 Acute upper respiratory infection, unspecified: Secondary | ICD-10-CM | POA: Diagnosis not present

## 2023-01-29 DIAGNOSIS — R0981 Nasal congestion: Secondary | ICD-10-CM | POA: Diagnosis not present

## 2023-03-18 DIAGNOSIS — H9201 Otalgia, right ear: Secondary | ICD-10-CM | POA: Diagnosis not present

## 2023-07-23 DIAGNOSIS — Z6831 Body mass index (BMI) 31.0-31.9, adult: Secondary | ICD-10-CM | POA: Diagnosis not present

## 2023-07-23 DIAGNOSIS — H6991 Unspecified Eustachian tube disorder, right ear: Secondary | ICD-10-CM | POA: Diagnosis not present

## 2023-07-23 DIAGNOSIS — R3911 Hesitancy of micturition: Secondary | ICD-10-CM | POA: Diagnosis not present

## 2023-07-23 DIAGNOSIS — R1011 Right upper quadrant pain: Secondary | ICD-10-CM | POA: Diagnosis not present

## 2023-09-16 IMAGING — CT CT ABD-PELV W/ CM
2 of 4 series · 16 of 46 positions shown, 18 images · IV contrast (APPLIED)
Comparison: None.

CLINICAL DATA: Right lower quadrant abdominal pain.

EXAM:
CT ABDOMEN AND PELVIS WITH CONTRAST
TECHNIQUE: Multidetector CT imaging of the abdomen and pelvis was performed
using the standard protocol following bolus administration of
intravenous contrast.
CONTRAST:  100mL OMNIPAQUE IOHEXOL 300 MG/ML  SOLN

[Series 2: abd pel w · axial · 0.80mm/px · z∈[-410,+60]mm · 13 of 104 slices shown, 15 images]
[im 5/104  soft-tissue]
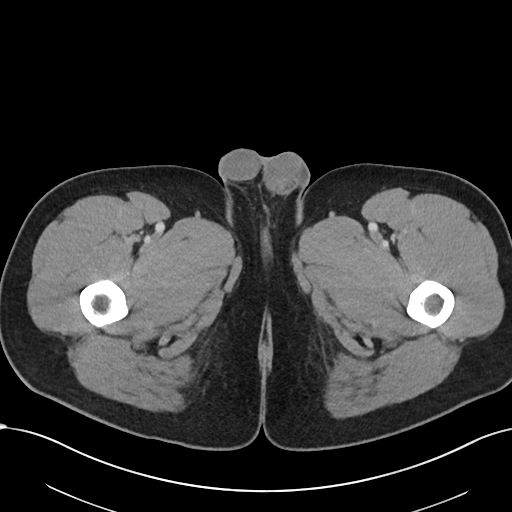
[im 5/104  bone]
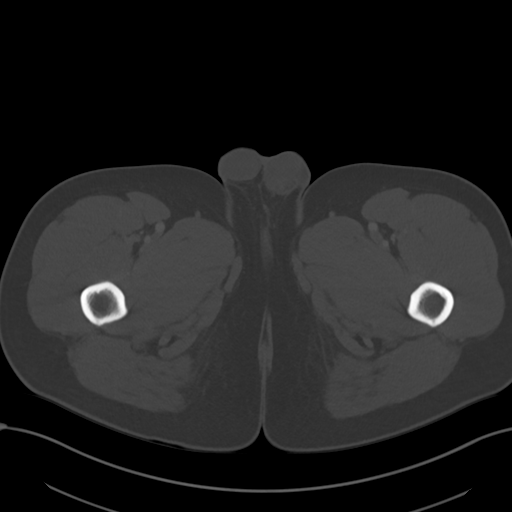
[im 14/104  soft-tissue]
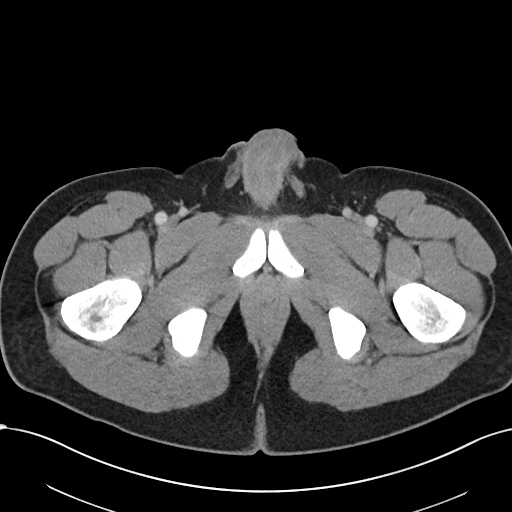
[im 23/104  soft-tissue]
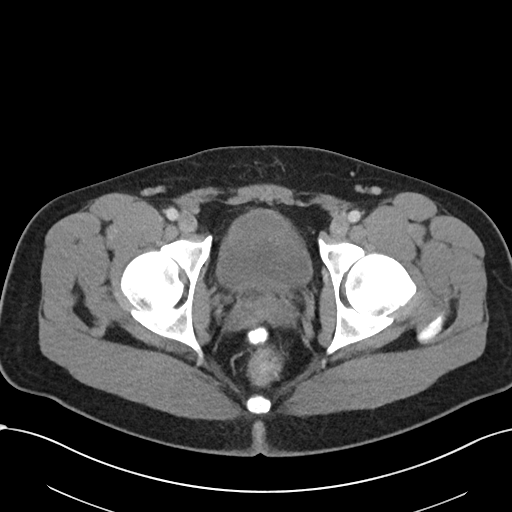
[im 27/104  soft-tissue]
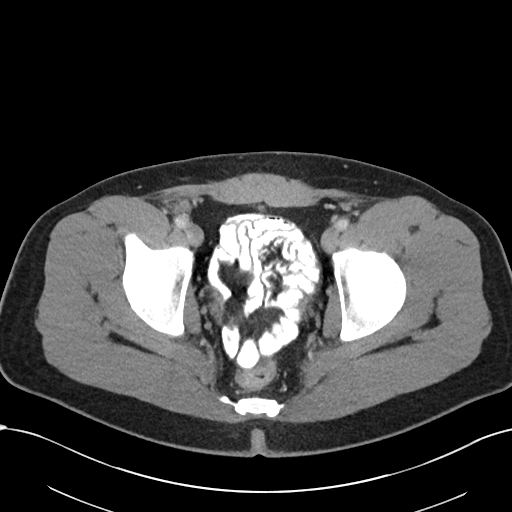
[im 36/104  soft-tissue]
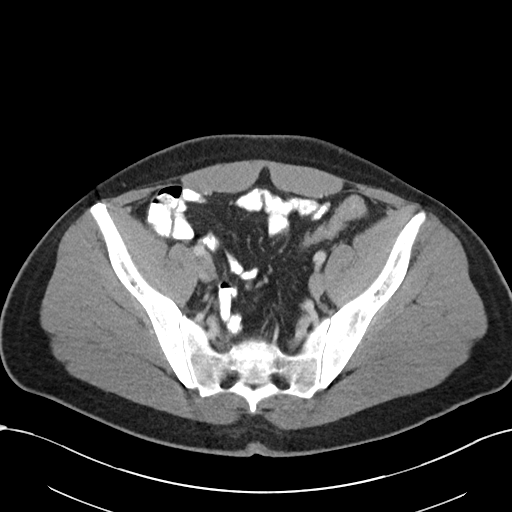
[im 45/104  soft-tissue]
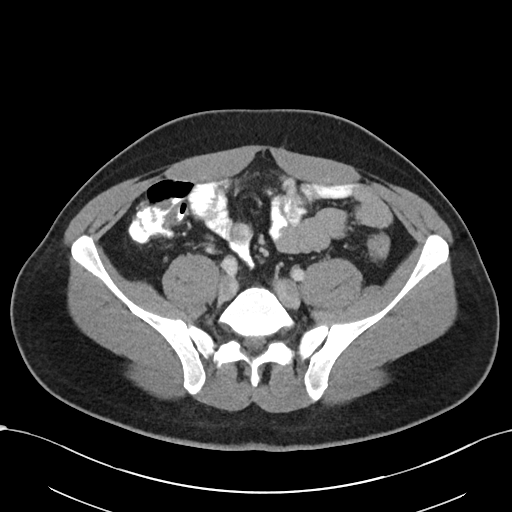
[im 54/104  soft-tissue]
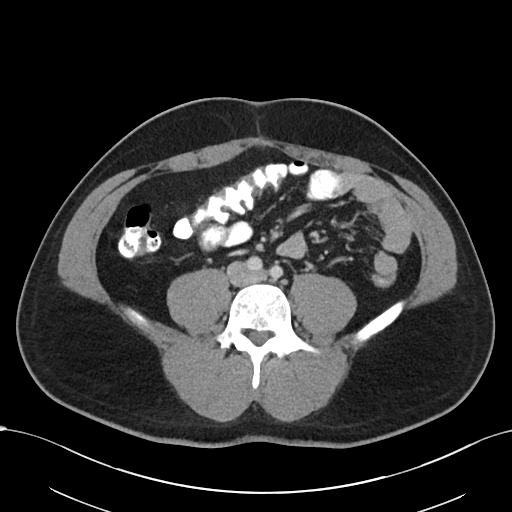
[im 59/104  soft-tissue]
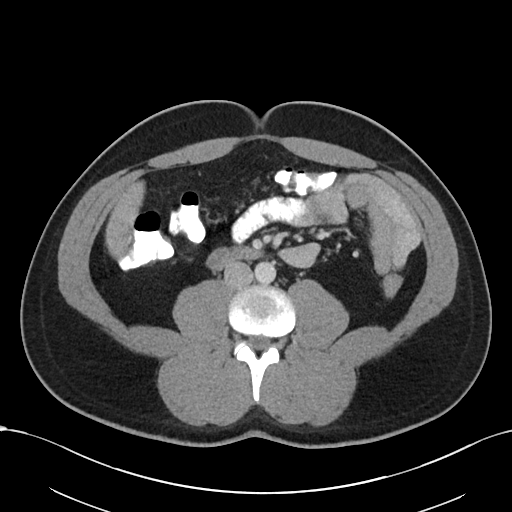
[im 68/104  soft-tissue]
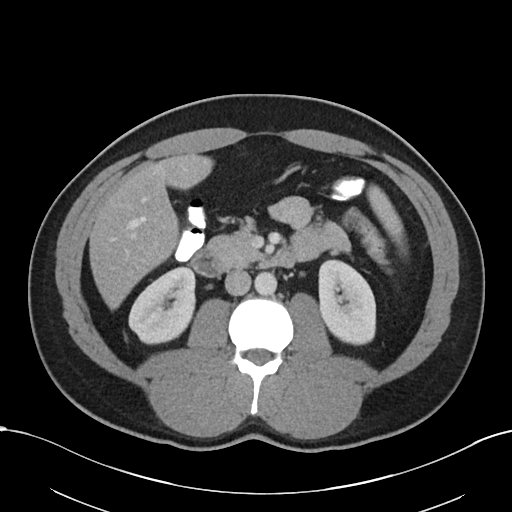
[im 68/104  bone]
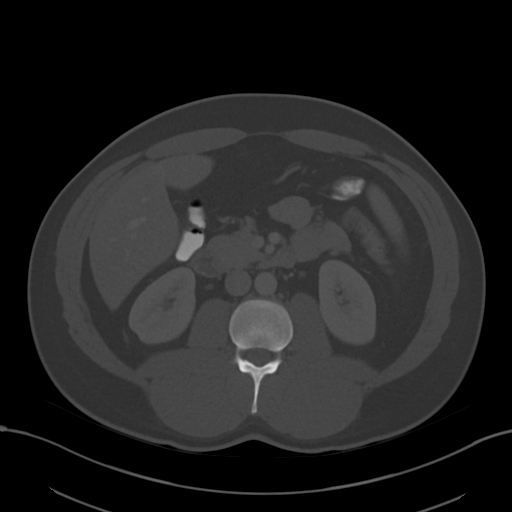
[im 77/104  soft-tissue]
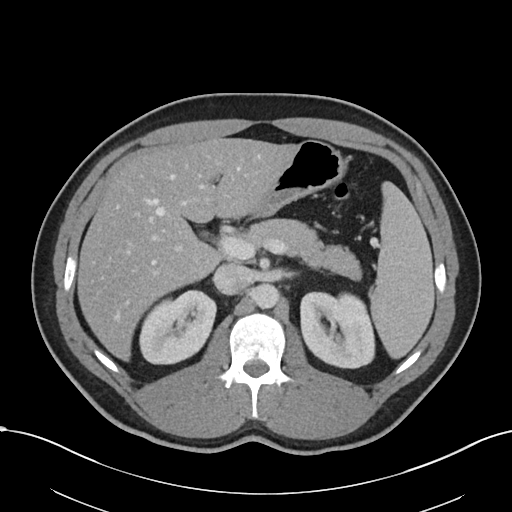
[im 81/104  soft-tissue]
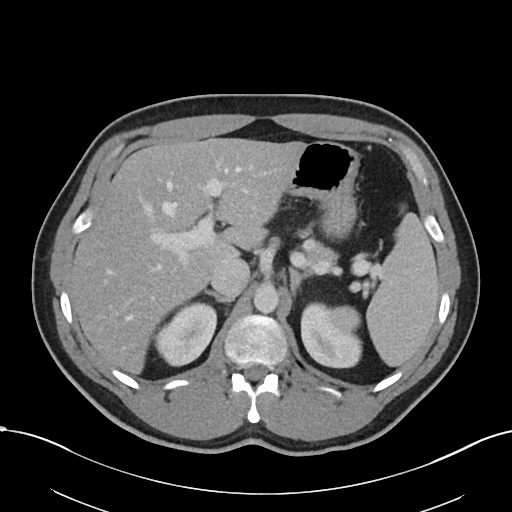
[im 90/104  soft-tissue]
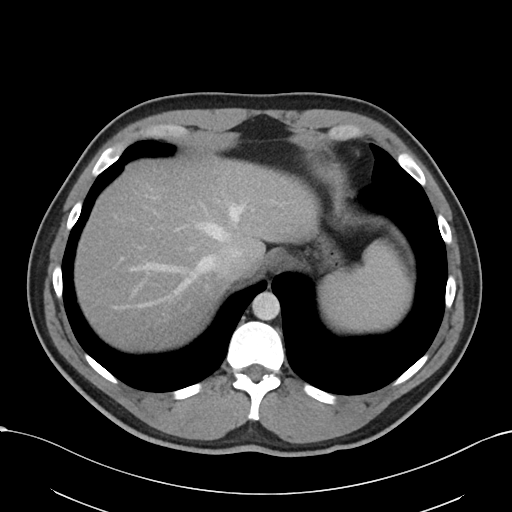
[im 99/104  soft-tissue]
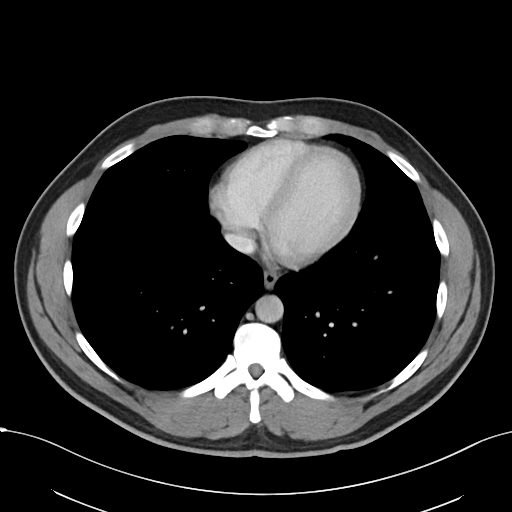

[Series 5: coronal · coronal · 0.84mm/px · 3 of 102 slices shown]
[im 34/102  soft-tissue]
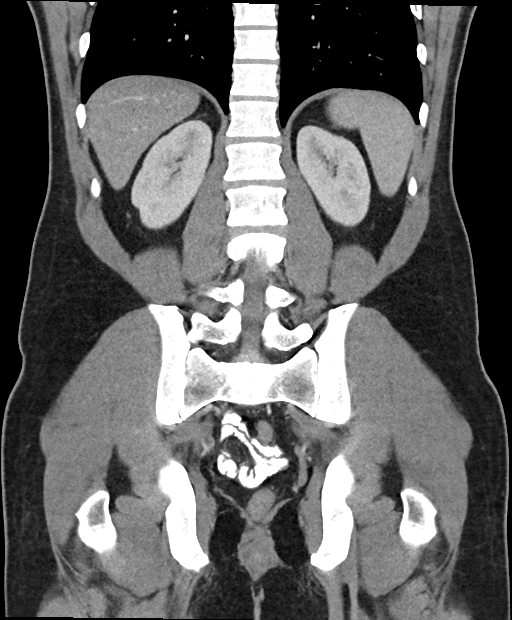
[im 45/102  soft-tissue]
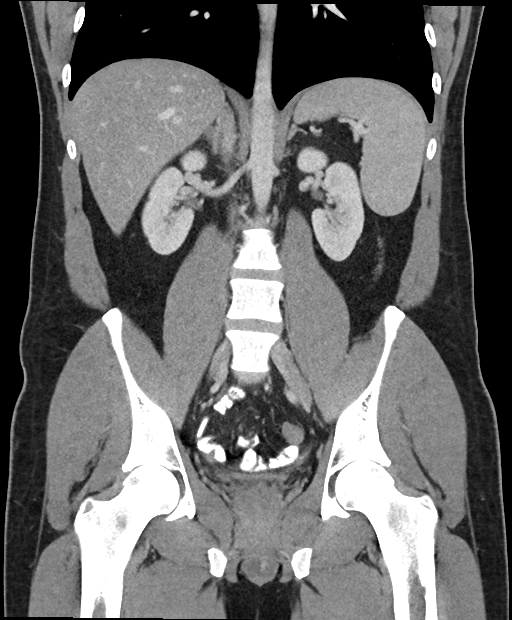
[im 57/102  soft-tissue]
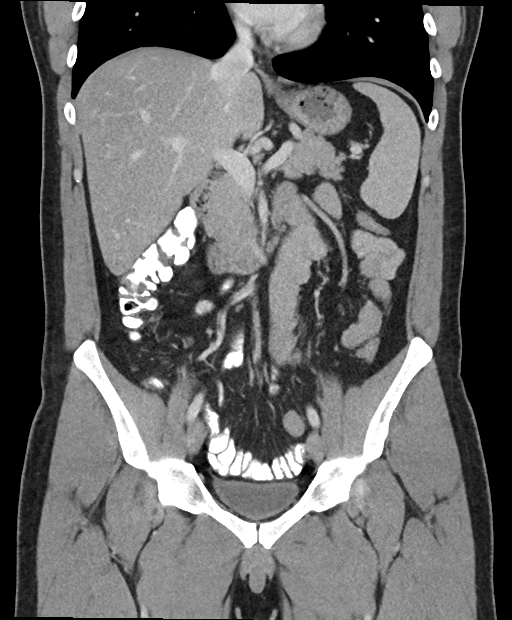

[16 of 46 positions shown; findings below may reference images not displayed]

FINDINGS: Lower chest: No acute abnormality.

Hepatobiliary: No focal liver abnormality is seen. Status post
cholecystectomy. No biliary dilatation.

Pancreas: Unremarkable. No pancreatic ductal dilatation or
surrounding inflammatory changes.

Spleen: Normal in size without focal abnormality.

Adrenals/Urinary Tract: Adrenal glands are unremarkable. Kidneys are
normal, without renal calculi, focal lesion, or hydronephrosis.
Bladder is unremarkable.

Stomach/Bowel: Stomach is within normal limits. Appendix appears
normal. No evidence of bowel wall thickening, distention, or
inflammatory changes.

Vascular/Lymphatic: No significant vascular findings are present. No
enlarged abdominal or pelvic lymph nodes.

Reproductive: Prostate is unremarkable.

Other: No abdominal wall hernia or abnormality. No abdominopelvic
ascites.

Musculoskeletal: No acute or significant osseous findings.
IMPRESSION: Status post cholecystectomy.

No acute abnormality seen in the abdomen or pelvis.

## 2024-01-06 DIAGNOSIS — M79672 Pain in left foot: Secondary | ICD-10-CM | POA: Diagnosis not present
